# Patient Record
Sex: Female | Born: 1955 | Hispanic: No | Marital: Married | Smoking: Never smoker
Health system: Southern US, Community
[De-identification: ages and names within clinical notes are randomized; demographics above are authoritative.]

## PROBLEM LIST (undated history)

## (undated) DIAGNOSIS — L309 Dermatitis, unspecified: Secondary | ICD-10-CM

## (undated) DIAGNOSIS — T783XXA Angioneurotic edema, initial encounter: Secondary | ICD-10-CM

## (undated) HISTORY — DX: Dermatitis, unspecified: L30.9

## (undated) HISTORY — DX: Angioneurotic edema, initial encounter: T78.3XXA

---

## 2008-01-31 ENCOUNTER — Encounter: Admission: RE | Admit: 2008-01-31 | Discharge: 2008-01-31 | Payer: Self-pay | Admitting: Family Medicine

## 2009-08-31 IMAGING — CR DG TOE 5TH 2+V*R*
1 series · 1 of 1 positions shown · non-contrast
Comparison: None

CLINICAL DATA: Stubbed fifth toe

RIGHT TOE - 2+ VIEW

[view not recorded]
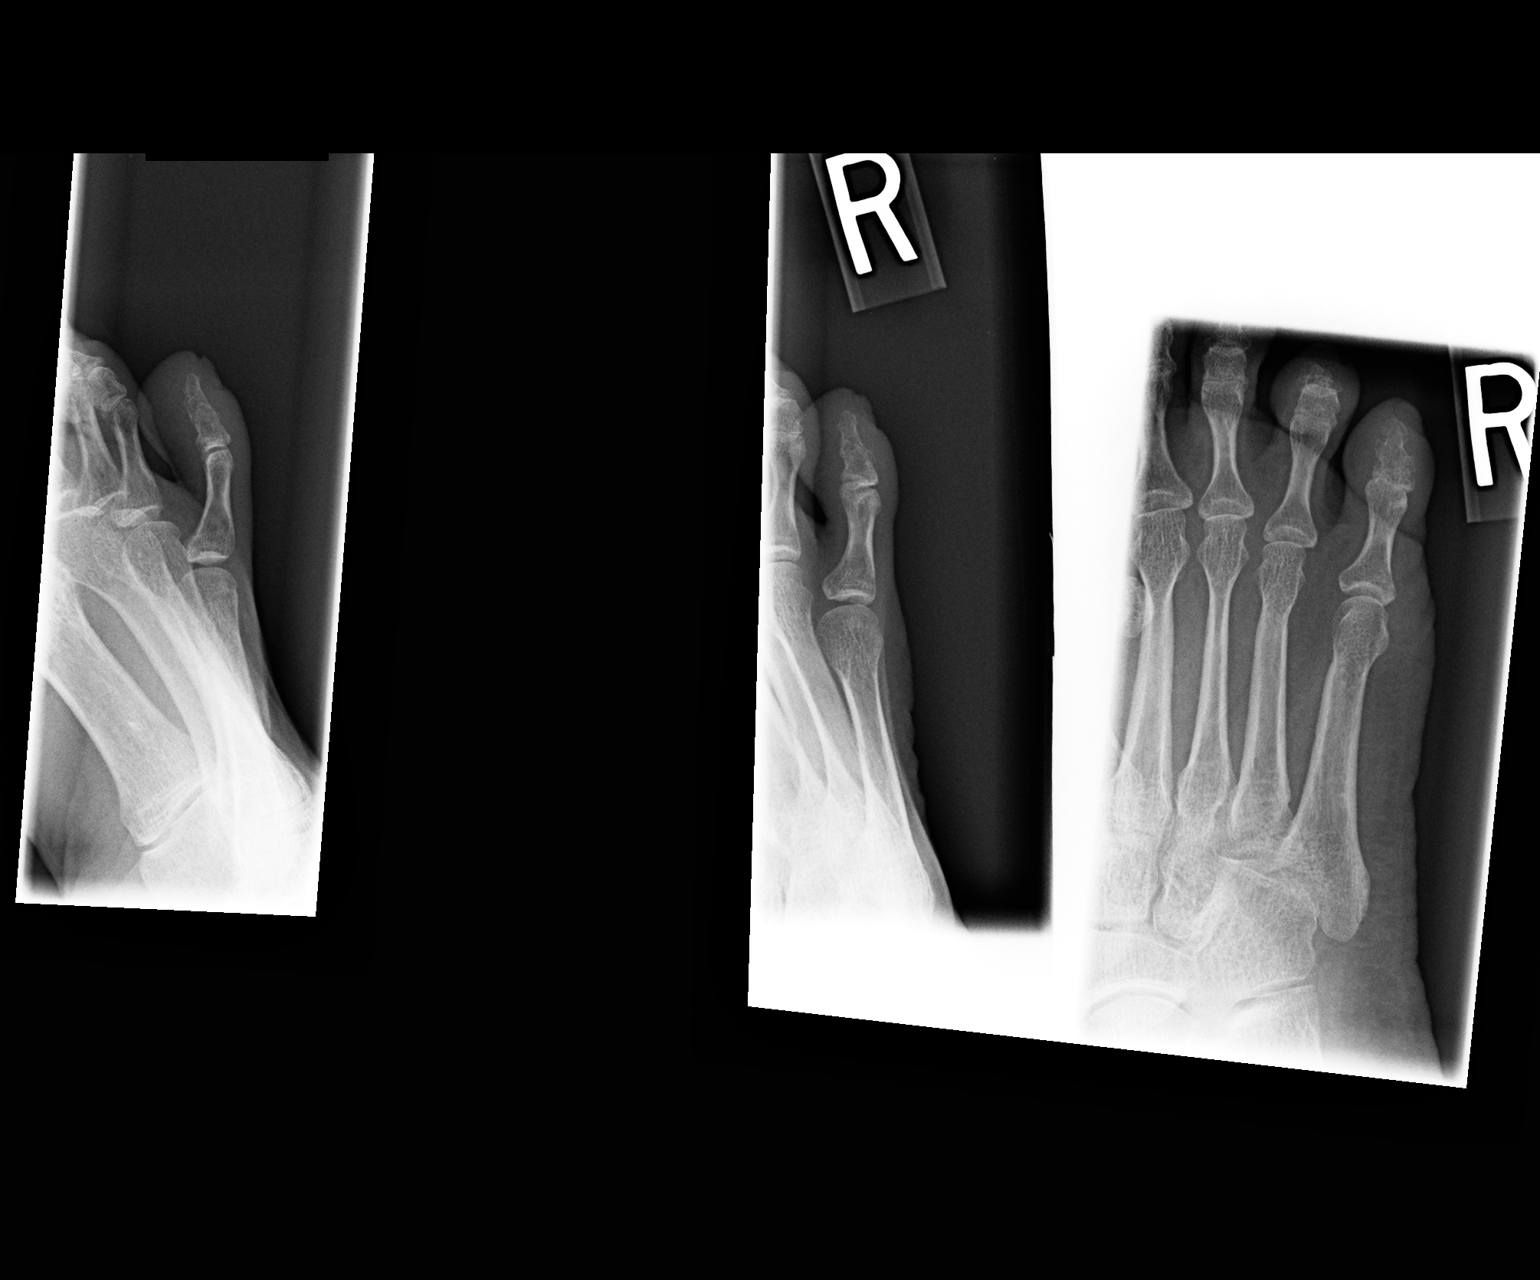

[1 of 1 positions shown; findings below may reference images not displayed]

FINDINGS: There is no evidence of fracture or dislocation.  There is no
evidence of arthropathy or other focal bone abnormality.  Soft
tissues are unremarkable.
IMPRESSION: No acute findings.

## 2019-05-02 ENCOUNTER — Ambulatory Visit: Payer: Self-pay | Admitting: Dermatology

## 2020-05-21 ENCOUNTER — Encounter (INDEPENDENT_AMBULATORY_CARE_PROVIDER_SITE_OTHER): Payer: Self-pay

## 2020-05-21 ENCOUNTER — Ambulatory Visit (INDEPENDENT_AMBULATORY_CARE_PROVIDER_SITE_OTHER): Admitting: Dermatology

## 2020-05-21 ENCOUNTER — Encounter: Payer: Self-pay | Admitting: Dermatology

## 2020-05-21 ENCOUNTER — Other Ambulatory Visit: Payer: Self-pay

## 2020-05-21 DIAGNOSIS — L259 Unspecified contact dermatitis, unspecified cause: Secondary | ICD-10-CM

## 2020-05-21 MED ORDER — TACROLIMUS 0.1 % EX OINT: TOPICAL_OINTMENT | CUTANEOUS | 0 refills | Status: DC

## 2020-05-21 NOTE — Patient Instructions (Signed)
Use topical Protopic x 3 weeks and come back in for patch testing here in the office.

## 2020-06-01 ENCOUNTER — Encounter: Payer: Self-pay | Admitting: Dermatology

## 2020-06-01 NOTE — Progress Notes (Signed)
   Follow-Up Visit   Subjective  Lydia Mcdowell is a 65 y.o. female who presents for the following: Eczema (On face, back and neck x months- tx- hydrocortisone 2.5%- helps some but not always).  Rash face and neck Location:  Duration:  Quality:  Associated Signs/Symptoms: Modifying Factors:  Severity:  Timing: Context:   Objective  Well appearing patient in no apparent distress; mood and affect are within normal limits. Objective  Head - Anterior (Face): Cheek chronic to subacute dermatitis without a distribution to suggest variant adult eczema.  Suspect indirect contact allergic dermatitis.    A focused examination was performed including Head, neck, hands.. Relevant physical exam findings are noted in the Assessment and Plan.   Assessment & Plan    Contact dermatitis, unspecified contact dermatitis type, unspecified trigger Head - Anterior (Face)  Generic Protopic ointment nightly for 3 to 4 weeks.  If not improved, schedule patch testing.  tacrolimus (PROTOPIC) 0.1 % ointment - Head - Anterior (Face)      I, Janalyn Harder, MD, have reviewed all documentation for this visit.  The documentation on 06/01/20 for the exam, diagnosis, procedures, and orders are all accurate and complete.

## 2020-06-18 ENCOUNTER — Other Ambulatory Visit: Payer: Self-pay | Admitting: Dermatology

## 2020-06-18 DIAGNOSIS — L259 Unspecified contact dermatitis, unspecified cause: Secondary | ICD-10-CM

## 2020-12-23 ENCOUNTER — Encounter: Payer: Self-pay | Admitting: Allergy

## 2020-12-23 ENCOUNTER — Other Ambulatory Visit: Payer: Self-pay

## 2020-12-23 ENCOUNTER — Ambulatory Visit: Admitting: Allergy

## 2020-12-23 VITALS — BP 136/84 | HR 68 | Temp 98.3°F | Resp 20 | Ht 62.6 in | Wt 143.0 lb

## 2020-12-23 DIAGNOSIS — J310 Chronic rhinitis: Secondary | ICD-10-CM

## 2020-12-23 DIAGNOSIS — L2089 Other atopic dermatitis: Secondary | ICD-10-CM

## 2020-12-23 NOTE — Patient Instructions (Addendum)
Today's skin testing showed: Negative to indoor/outdoor allergens and select foods.   Rhinitis:  Use over the counter antihistamines such as Zyrtec (cetirizine), Claritin (loratadine), Allegra (fexofenadine), or Xyzal (levocetirizine) daily as needed. May switch antihistamines every few months. May use saline nasal spray as needed.   Eczema: Continue proper skincare as below. Continue recommendations as per dermatology - continue Dupixent injections every 2 weeks.  Follow up if needed.     Skin care recommendations  Bath time: Always use lukewarm water. AVOID very hot or cold water. Keep bathing time to 5-10 minutes. Do NOT use bubble bath. Use a mild soap and use just enough to wash the dirty areas. Do NOT scrub skin vigorously.  After bathing, pat dry your skin with a towel. Do NOT rub or scrub the skin.  Moisturizers and prescriptions:  ALWAYS apply moisturizers immediately after bathing (within 3 minutes). This helps to lock-in moisture. Use the moisturizer several times a day over the whole body. Good summer moisturizers include: Aveeno, CeraVe, Cetaphil. Good winter moisturizers include: Aquaphor, Vaseline, Cerave, Cetaphil, Eucerin, Vanicream. When using moisturizers along with medications, the moisturizer should be applied about one hour after applying the medication to prevent diluting effect of the medication or moisturize around where you applied the medications. When not using medications, the moisturizer can be continued twice daily as maintenance.  Laundry and clothing: Avoid laundry products with added color or perfumes. Use unscented hypo-allergenic laundry products such as Tide free, Cheer free & gentle, and All free and clear.  If the skin still seems dry or sensitive, you can try double-rinsing the clothes. Avoid tight or scratchy clothing such as wool. Do not use fabric softeners or dyer sheets.

## 2020-12-23 NOTE — Progress Notes (Signed)
New Patient Note  RE: Lydia Mcdowell MRN: [id number] DOB: 1955/03/09 Date of Office Visit: 12/23/2020  Consult requested by: No ref. provider found Primary care provider: Loyola Mast, NP  Chief Complaint: Eczema (2 weeks ago eczema really really bad, dermatologist gave dupixent and skin is clearing up and they recommended her to see an allergist.) and Allergies (Has had an itchy throat, anything chemical and triggers a cough even if she doesn't have  cough. )  History of Present Illness: I had the pleasure of seeing Lydia Mcdowell for initial evaluation at the [facility] on 12/24/2020. [name], who is self-referred here for the evaluation of rash.  Rash started about 6 months ago. Mainly occurs on her face and neck. Describes them as itchy, red, extremely dry. Associated symptoms include: none.  Patient had Covid-19 in December 2021 and UTI in April 2022. This started in May 2022.  Suspected triggers are unknown. Denies any fevers, chills, changes in medications, foods, personal care products or recent infections. She has tried the following therapies: Dupixent x 2 doses with great benefit.  Tried Protopic and hydrocortisone with minimal benefit.  Systemic steroids no. Currently on Dupixent every 2 weeks.  Previous work up includes: patient saw dermatologist and started on Dupixent which has helped tremendously. Previous history of rash/hives: no. Patient is up to date with the following cancer screening tests: physical, mammogram, needs colonoscopy.  Rhinitis: She reports symptoms of sneezing, coughing, PND, itchy throat. Symptoms have been going on for 20 years. The symptoms are present  all year around. Other triggers include exposure to chemicals. Headache: sometimes. She has used zyrtec with fair improvement in symptoms. Nasal spray caused nosebleeds in the past.  Sinus infections: no. Previous work up includes: skin testing 20+ years ago and not sure  of results. No prior AIT.  Previous ENT evaluation: not recently. Previous sinus imaging: no. History of nasal polyps: no. History of reflux: no.  Patient brought in a bag of hair that she lost during this period.  She also brought in a bag of dry skin.  Assessment and Plan: Lydia Mcdowell is a 65 y.o. Mcdowell with: Other atopic dermatitis Developed a very dry, red and itchy rash mainly on her face and neck in May 2022.  Patient also had some hair loss with this.  Tried Protopic and hydrocortisone with minimal benefit.  Saw dermatology who started her Dupixent and had 2 doses with very good benefit.  Concerned about potential allergic triggers.  Denies any changes in diet, medication, personal care products. Today's skin testing showed: Negative to indoor/outdoor allergens and select foods.  Discussed with patient that given timeline and negative skin testing results that is unlikely that her eczema is triggered by environmental or food allergies.  Continue proper skincare as below. Continue recommendations as per dermatology - continue Dupixent injections every 2 weeks.  Chronic rhinitis Perennial rhinitis symptoms for 20 years.  Main triggers are chemicals.  Skin testing over 20 years ago but unsure of results.  No prior AIT. Nasal sprays caused epistaxis in the past. Zyrtec helps.  Today skin prick testing was negative to indoor/outdoor allergens. Use over the counter antihistamines such as Zyrtec (cetirizine), Claritin (loratadine), Allegra (fexofenadine), or Xyzal (levocetirizine) daily as needed. May switch antihistamines every few months. May use saline nasal spray as needed.   Return if symptoms worsen or fail to improve.  No orders of the defined types were placed in this encounter.  Lab Orders  No laboratory test(s) ordered today    Other allergy screening: Asthma: no Food allergy: no Medication allergy: no Hymenoptera allergy: no History of recurrent infections suggestive of  immunodeficency: no  Diagnostics: Skin Testing: Environmental allergy panel and select foods. Negative to indoor/outdoor allergens and select foods.  Results discussed with patient/family.  Airborne Adult Perc - 12/23/20 1552     Time Antigen Placed 0350    Allergen Manufacturer Waynette Buttery    Location Back    Number of Test 59    Panel 1 Select    1. Control-Buffer 50% Glycerol Negative    2. Control-Histamine 1 mg/ml 2+    3. Albumin saline Negative    4. Bahia Negative    5. French Southern Territories Negative    6. Johnson Negative    7. Kentucky Blue Negative    8. Meadow Fescue Negative    9. Perennial Rye Negative    10. Sweet Vernal Negative    11. Timothy Negative    12. Cocklebur Negative    13. Burweed Marshelder Negative    14. Ragweed, short Negative    15. Ragweed, Giant Negative    16. Plantain,  English Negative    17. Lamb's Quarters Negative    18. Sheep Sorrell Negative    19. Rough Pigweed Negative    20. Marsh Elder, Rough Negative    21. Mugwort, Common Negative    22. Ash mix Negative    23. Birch mix Negative    24. Beech American Negative    25. Box, Elder Negative    26. Cedar, red Negative    27. Cottonwood, Guinea-Bissau Negative    28. Elm mix Negative    29. Hickory Negative    30. Maple mix Negative    31. Oak, Guinea-Bissau mix Negative    32. Pecan Pollen Negative    33. Pine mix Negative    34. Sycamore Eastern Negative    35. Walnut, Black Pollen Negative    36. Alternaria alternata Negative    37. Cladosporium Herbarum Negative    38. Aspergillus mix Negative    39. Penicillium mix Negative    40. Bipolaris sorokiniana (Helminthosporium) Negative    41. Drechslera spicifera (Curvularia) Negative    42. Mucor plumbeus Negative    43. Fusarium moniliforme Negative    44. Aureobasidium pullulans (pullulara) Negative    45. Rhizopus oryzae Negative    46. Botrytis cinera Negative    47. Epicoccum nigrum Negative    48. Phoma betae Negative    49. Candida  Albicans Negative    50. Trichophyton mentagrophytes Negative    51. Mite, D Farinae  5,000 AU/ml Negative    52. Mite, D Pteronyssinus  5,000 AU/ml Negative    53. Cat Hair 10,000 BAU/ml Negative    54.  Dog Epithelia Negative    55. Mixed Feathers Negative    56. Horse Epithelia Negative    57. Cockroach, German Negative    58. Mouse Negative    59. Tobacco Leaf Negative             Food Perc - 12/23/20 1552       Test Information   Time Antigen Placed 0350    Allergen Manufacturer Waynette Buttery    Location Back    Number of allergen test 10    Food Select      Food   1. Peanut Negative    2. Soybean food Negative    3. Wheat,  whole Negative    4. Sesame Negative    5. Milk, cow Negative    6. Egg White, chicken Negative    7. Casein Negative    8. Shellfish mix Negative    9. Fish mix Negative    10. Cashew Negative             Past Medical History: Patient Active Problem List   Diagnosis Date Noted   Other atopic dermatitis 12/24/2020   Chronic rhinitis 12/24/2020    Past Medical History:  Diagnosis Date   Angio-edema    Eczema    Past Surgical History: History reviewed. No pertinent surgical history. Medication List:  Current Outpatient Medications  Medication Sig Dispense Refill   DUPIXENT 300 MG/2ML SOPN Inject into the skin.     No current facility-administered medications for this visit.   Allergies: Allergies  Allergen Reactions   Estrogenic Substance Other (See Comments)   Social History: Social History   Socioeconomic History   Marital status: Married    Spouse name: Not on file   Number of children: Not on file   Years of education: Not on file   Highest education level: Not on file  Occupational History   Not on file  Tobacco Use   Smoking status: Never   Smokeless tobacco: Never  Vaping Use   Vaping Use: Never used  Substance and Sexual Activity   Alcohol use: Never   Drug use: Never   Sexual activity: Not on file   Other Topics Concern   Not on file  Social History Narrative   Not on file   Social Determinants of Health   Financial Resource Strain: Not on file  Food Insecurity: Not on file  Transportation Needs: Not on file  Physical Activity: Not on file  Stress: Not on file  Social Connections: Not on file   Lives in a 64 year old house. Smoking: denies Occupation: Equities trader HistorySurveyor, minerals in the house: no Engineer, civil (consulting) in the family room: no Carpet in the bedroom: yes Heating: gas Cooling: central Pet: yes 2 dogs x 8 yrs  Family History: Family History  Problem Relation Age of Onset   Allergic rhinitis Neg Hx    Asthma Neg Hx    Eczema Neg Hx    Urticaria Neg Hx    Review of Systems  Constitutional:  Negative for appetite change, chills, fever and unexpected weight change.  HENT:  Positive for postnasal drip and sneezing. Negative for congestion and rhinorrhea.   Eyes:  Negative for itching.  Respiratory:  Positive for cough. Negative for chest tightness, shortness of breath and wheezing.   Cardiovascular:  Negative for chest pain.  Gastrointestinal:  Negative for abdominal pain.  Genitourinary:  Negative for difficulty urinating.  Skin:  Positive for rash.  Allergic/Immunologic: Negative for environmental allergies and food allergies.  Neurological:  Negative for headaches.   Objective: BP 136/84 (BP Location: Right Arm, Patient Position: Sitting, Cuff Size: Normal)   Pulse 68   Temp 98.3 F (36.8 C) (Temporal)   Resp 20   Ht 5' 2.6" (1.59 m)   Wt 143 lb (64.9 kg)   SpO2 95%   BMI 25.66 kg/m  Body mass index is 25.66 kg/m. Physical Exam Vitals and nursing note reviewed.  Constitutional:      Appearance: Normal appearance. She is well-developed.  HENT:     Head: Normocephalic and atraumatic.     Right Ear: Tympanic membrane and external ear normal.  Left Ear: Tympanic membrane and external ear normal.     Nose: Nose normal.      Mouth/Throat:     Mouth: Mucous membranes are moist.     Pharynx: Oropharynx is clear.  Eyes:     Conjunctiva/sclera: Conjunctivae normal.  Cardiovascular:     Rate and Rhythm: Normal rate and regular rhythm.     Heart sounds: Normal heart sounds. No murmur heard.   No friction rub. No gallop.  Pulmonary:     Effort: Pulmonary effort is normal.     Breath sounds: Normal breath sounds. No wheezing, rhonchi or rales.  Musculoskeletal:     Cervical back: Neck supple.  Skin:    General: Skin is warm and dry.     Findings: Rash present.     Comments: Dry patches near the hairline around the temporal area b/l. Faint erythematous patch on antecubital fossa area b/l.  Neurological:     Mental Status: She is alert and oriented to person, place, and time.  Psychiatric:        Behavior: Behavior normal.  The plan was reviewed with the patient/family, and all questions/concerned were addressed.  It was my pleasure to see Lydia Mcdowell today and participate in her care. Please feel free to contact me with any questions or concerns.  Sincerely,  Wyline Mood, DO Allergy & Immunology  [facility] office: [phone] [facility] office: [phone]

## 2020-12-24 ENCOUNTER — Encounter: Payer: Self-pay | Admitting: Allergy

## 2020-12-24 DIAGNOSIS — L2089 Other atopic dermatitis: Secondary | ICD-10-CM | POA: Insufficient documentation

## 2020-12-24 DIAGNOSIS — J310 Chronic rhinitis: Secondary | ICD-10-CM | POA: Insufficient documentation

## 2020-12-24 NOTE — Assessment & Plan Note (Signed)
Perennial rhinitis symptoms for 20 years.  Main triggers are chemicals.  Skin testing over 20 years ago but unsure of results.  No prior AIT. Nasal sprays caused epistaxis in the past. Zyrtec helps.   Today skin prick testing was negative to indoor/outdoor allergens.  Use over the counter antihistamines such as Zyrtec (cetirizine), Claritin (loratadine), Allegra (fexofenadine), or Xyzal (levocetirizine) daily as needed. May switch antihistamines every few months.  May use saline nasal spray as needed.

## 2020-12-24 NOTE — Assessment & Plan Note (Signed)
Developed a very dry, red and itchy rash mainly on her face and neck in May 2022.  Patient also had some hair loss with this.  Tried Protopic and hydrocortisone with minimal benefit.  Saw dermatology who started her Dupixent and had 2 doses with very good benefit.  Concerned about potential allergic triggers.  Denies any changes in diet, medication, personal care products.  Today's skin testing showed: Negative to indoor/outdoor allergens and select foods.   Discussed with patient that given timeline and negative skin testing results that is unlikely that her eczema is triggered by environmental or food allergies.  . Continue proper skincare as below. . Continue recommendations as per dermatology - continue Dupixent injections every 2 weeks.
